# Patient Record
Sex: Female | Born: 1960 | Race: White | Hispanic: No | Marital: Married | Smoking: Former smoker
Health system: Southern US, Community
[De-identification: ages and names within clinical notes are randomized; demographics above are authoritative.]

## PROBLEM LIST (undated history)

## (undated) DIAGNOSIS — E079 Disorder of thyroid, unspecified: Secondary | ICD-10-CM

## (undated) DIAGNOSIS — J329 Chronic sinusitis, unspecified: Secondary | ICD-10-CM

## (undated) DIAGNOSIS — F419 Anxiety disorder, unspecified: Secondary | ICD-10-CM

## (undated) HISTORY — PX: THYROIDECTOMY: SHX17

## (undated) HISTORY — PX: COLON SURGERY: SHX602

---

## 1998-04-29 ENCOUNTER — Other Ambulatory Visit: Admission: RE | Admit: 1998-04-29 | Discharge: 1998-04-29 | Payer: Self-pay | Admitting: Obstetrics and Gynecology

## 1998-10-24 ENCOUNTER — Inpatient Hospital Stay (HOSPITAL_COMMUNITY): Admission: AD | Admit: 1998-10-24 | Discharge: 1998-10-26 | Payer: Self-pay | Admitting: Obstetrics and Gynecology

## 2008-01-05 ENCOUNTER — Encounter: Admission: RE | Admit: 2008-01-05 | Discharge: 2008-01-05 | Payer: Self-pay | Admitting: Family Medicine

## 2008-01-14 ENCOUNTER — Encounter: Admission: RE | Admit: 2008-01-14 | Discharge: 2008-01-14 | Payer: Self-pay | Admitting: Family Medicine

## 2008-04-14 ENCOUNTER — Encounter (INDEPENDENT_AMBULATORY_CARE_PROVIDER_SITE_OTHER): Payer: Self-pay | Admitting: Interventional Radiology

## 2008-04-14 ENCOUNTER — Encounter: Admission: RE | Admit: 2008-04-14 | Discharge: 2008-04-14 | Payer: Self-pay | Admitting: Endocrinology

## 2008-04-14 ENCOUNTER — Other Ambulatory Visit: Admission: RE | Admit: 2008-04-14 | Discharge: 2008-04-14 | Payer: Self-pay | Admitting: Interventional Radiology

## 2008-08-16 ENCOUNTER — Other Ambulatory Visit: Admission: RE | Admit: 2008-08-16 | Discharge: 2008-08-16 | Payer: Self-pay | Admitting: Internal Medicine

## 2008-08-27 ENCOUNTER — Ambulatory Visit (HOSPITAL_COMMUNITY): Admission: RE | Admit: 2008-08-27 | Discharge: 2008-08-28 | Payer: Self-pay | Admitting: Surgery

## 2008-08-27 ENCOUNTER — Encounter (INDEPENDENT_AMBULATORY_CARE_PROVIDER_SITE_OTHER): Payer: Self-pay | Admitting: Surgery

## 2008-08-31 ENCOUNTER — Emergency Department (HOSPITAL_COMMUNITY): Admission: EM | Admit: 2008-08-31 | Discharge: 2008-08-31 | Payer: Self-pay | Admitting: Emergency Medicine

## 2008-11-25 ENCOUNTER — Encounter (INDEPENDENT_AMBULATORY_CARE_PROVIDER_SITE_OTHER): Payer: Self-pay | Admitting: *Deleted

## 2008-11-25 ENCOUNTER — Ambulatory Visit (HOSPITAL_COMMUNITY): Admission: RE | Admit: 2008-11-25 | Discharge: 2008-11-25 | Payer: Self-pay | Admitting: *Deleted

## 2008-12-02 ENCOUNTER — Encounter: Admission: RE | Admit: 2008-12-02 | Discharge: 2008-12-02 | Payer: Self-pay | Admitting: *Deleted

## 2008-12-10 ENCOUNTER — Ambulatory Visit (HOSPITAL_COMMUNITY): Admission: RE | Admit: 2008-12-10 | Discharge: 2008-12-10 | Payer: Self-pay | Admitting: Surgery

## 2008-12-21 ENCOUNTER — Encounter (INDEPENDENT_AMBULATORY_CARE_PROVIDER_SITE_OTHER): Payer: Self-pay | Admitting: Surgery

## 2008-12-21 ENCOUNTER — Inpatient Hospital Stay (HOSPITAL_COMMUNITY): Admission: RE | Admit: 2008-12-21 | Discharge: 2008-12-26 | Payer: Self-pay | Admitting: Surgery

## 2010-03-27 ENCOUNTER — Encounter: Admission: RE | Admit: 2010-03-27 | Discharge: 2010-03-27 | Payer: Self-pay | Admitting: Family Medicine

## 2010-03-27 ENCOUNTER — Ambulatory Visit: Payer: Self-pay | Admitting: Family Medicine

## 2010-03-27 DIAGNOSIS — M25569 Pain in unspecified knee: Secondary | ICD-10-CM | POA: Insufficient documentation

## 2010-03-27 DIAGNOSIS — E890 Postprocedural hypothyroidism: Secondary | ICD-10-CM | POA: Insufficient documentation

## 2010-03-27 DIAGNOSIS — M533 Sacrococcygeal disorders, not elsewhere classified: Secondary | ICD-10-CM | POA: Insufficient documentation

## 2010-03-27 DIAGNOSIS — M25559 Pain in unspecified hip: Secondary | ICD-10-CM | POA: Insufficient documentation

## 2010-03-27 DIAGNOSIS — C189 Malignant neoplasm of colon, unspecified: Secondary | ICD-10-CM | POA: Insufficient documentation

## 2010-03-29 ENCOUNTER — Encounter: Admission: RE | Admit: 2010-03-29 | Discharge: 2010-04-24 | Payer: Self-pay | Admitting: Family Medicine

## 2010-04-17 ENCOUNTER — Ambulatory Visit: Payer: Self-pay | Admitting: Family Medicine

## 2010-04-17 ENCOUNTER — Other Ambulatory Visit: Admission: RE | Admit: 2010-04-17 | Discharge: 2010-04-17 | Payer: Self-pay | Admitting: Family Medicine

## 2010-04-18 ENCOUNTER — Encounter: Payer: Self-pay | Admitting: Family Medicine

## 2010-04-18 LAB — CONVERTED CEMR LAB
LDL Cholesterol: 129 mg/dL — ABNORMAL HIGH (ref 0–99)
Triglycerides: 90 mg/dL (ref <150)
VLDL: 18 mg/dL (ref 0–40)

## 2010-04-19 LAB — CONVERTED CEMR LAB
ALT: 10 units/L (ref 0–35)
AST: 14 units/L (ref 0–37)
Albumin: 4.2 g/dL (ref 3.5–5.2)
CO2: 21 meq/L (ref 19–32)
Calcium: 8.3 mg/dL — ABNORMAL LOW (ref 8.4–10.5)
Chloride: 106 meq/L (ref 96–112)
Creatinine, Ser: 0.76 mg/dL (ref 0.40–1.20)
Potassium: 4.1 meq/L (ref 3.5–5.3)
Total Protein: 7 g/dL (ref 6.0–8.3)

## 2010-04-20 ENCOUNTER — Encounter: Payer: Self-pay | Admitting: Family Medicine

## 2010-04-20 LAB — CONVERTED CEMR LAB: Pap Smear: NEGATIVE

## 2010-07-04 NOTE — Assessment & Plan Note (Signed)
Summary: CPE with pap   Vital Signs:  Patient profile:   50 year old female Menstrual status:  regular Height:      66 inches Weight:      194 pounds BMI:     31.43 O2 Sat:      97 % on Room air Pulse rate:   74 / minute BP sitting:   107 / 69  (left arm) Cuff size:   regular  Vitals Entered By: Payton Spark CMA (April 17, 2010 10:40 AM)  O2 Flow:  Room air CC: CPE w/ pap   Primary Care Kenzie Flakes:  Seymour Bars DO  CC:  CPE w/ pap.  History of Present Illness: 50 yo F presents for CPE with pap smear.   She has hx of colon cancer in June 2010, treated with surgery and chemo.  She has recently had a f/u colonoscopy with Dr Loreta Ave that was 'normal'.  She is unsure of last Tetanus vaccine.  She is perimenopausal with irregular periods.  Due for fasting labs today.  Declined flu shot.    Married, monogamous.  No hx of cervical dysplasia. Mammogram done this summer.  Current Medications (verified): 1)  Clonazepam 0.5 Mg Tabs (Clonazepam) .... Take 1 Tab By Mouth Once Daily As Needed 2)  Calcitriol 0.5 Mcg Caps (Calcitriol) .... Take 1 Cap By Mouth Once Daily 3)  Levothroid 125 Mcg Tabs (Levothyroxine Sodium) .... Take 1 Tab By Mouth Once Daily 4)  Restora  Caps (Probiotic Product) .... Take 1 Cap By Mouth Once Daily 5)  Vitamin D 1000 Unit Tabs (Cholecalciferol) .... Take 1 Tab By Mouth Once Daily 6)  Stool Softener 100 Mg Caps (Docusate Sodium) 7)  Calcium 600 1500 Mg Tabs (Calcium Carbonate) 8)  Tums Ultra 1000 1000 Mg Chew (Calcium Carbonate Antacid)  Allergies (verified): No Known Drug Allergies  Past History:  Past Medical History: Reviewed history from 03/27/2010 and no changes required. colon cancer June 2010, [facility], Dr Loreta Ave pre- cancerous thyroid cells -- Dr Donney Rankins  Past Surgical History: Reviewed history from 03/27/2010 and no changes required. thyroidectomy 08-2008 partial colectomy for colon cancer 12-2008; repeat 04-2009  Family  History: Reviewed history from 03/27/2010 and no changes required. father bladder cancer no fam hx of colorectal cancer. mother healthy no sibblings.  Social History: Reviewed history from 03/27/2010 and no changes required. Married.  Has a 49 yo son and 74 yo daughter. 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 Denies ETOH. [place]ginially from [place]   Some exercise.    Review of Systems       The patient complains of weight gain.  The patient denies anorexia, fever, weight loss, vision loss, decreased hearing, hoarseness, chest pain, syncope, dyspnea on exertion, peripheral edema, prolonged cough, headaches, hemoptysis, abdominal pain, melena, hematochezia, severe indigestion/heartburn, hematuria, incontinence, genital sores, muscle weakness, suspicious skin lesions, transient blindness, difficulty walking, depression, unusual weight change, abnormal bleeding, enlarged lymph nodes, angioedema, breast masses, and testicular masses.    Physical Exam  General:  alert, well-developed, well-nourished, well-hydrated, and overweight-appearing.   Head:  normocephalic and atraumatic.   Eyes:  pupils equal, pupils round, and pupils reactive to light.   Ears:  no external deformities.   Nose:  no nasal discharge.   Mouth:  good dentition and pharynx pink and moist.   Neck:  no masses.   Breasts:  No mass, nodules, thickening, tenderness, bulging, retraction, inflamation, nipple discharge or skin changes noted.  Lungs:  Normal respiratory effort, chest expands symmetrically. Lungs are clear to auscultation, no crackles or wheezes. Heart:  Normal rate and regular rhythm. S1 and S2 normal without gallop, murmur, click, rub or other extra sounds. Abdomen:  Bowel sounds positive,abdomen soft and non-tender without masses, organomegaly or hernias noted. Genitalia:  Pelvic Exam:        External: normal female genitalia without lesions or masses        Vagina: normal without  lesions or masses        Cervix: normal without lesions, +cervical polyp (small) present from the os without bleeding        Adnexa: normal bimanual exam without masses or fullness        Uterus: normal by palpation        Pap smear: performed Pulses:  2+ radial and pedal pulses Extremities:  no LE edema   Impression & Recommendations:  Problem # 1:  ROUTINE GYNECOLOGICAL EXAMINATION (ICD-V72.31) Thin prep pap done.   Repeat in 1 yr due to cervical polyp. BP at goal.  BMI 31 c/w class I obesity. Mammo UTD. Tetanus unknown. Seeing GI annual due to hx of colon cancer.  Seeing Dr Talmage Nap for thyroid. Fasting labs today. Add MVI to calcium + D daily.  Complete Medication List: 1)  Clonazepam 0.5 Mg Tabs (Clonazepam) .... Take 1 tab by mouth once daily as needed 2)  Calcitriol 0.5 Mcg Caps (Calcitriol) .... Take 1 cap by mouth once daily 3)  Levothroid 125 Mcg Tabs (Levothyroxine sodium) .... Take 1 tab by mouth once daily 4)  Restora Caps (Probiotic product) .... Take 1 cap by mouth once daily 5)  Vitamin D 1000 Unit Tabs (Cholecalciferol) .... Take 1 tab by mouth once daily 6)  Stool Softener 100 Mg Caps (Docusate sodium) 7)  Calcium 600 1500 Mg Tabs (Calcium carbonate) 8)  Tums Ultra 1000 1000 Mg Chew (Calcium carbonate antacid)  Other Orders: T-Coombs test, Direct (29562) T-Lipid Profile (13086-57846) T-TSH ([id number])   Orders Added: 1)  T-Coombs test, Direct [[id number]] 2)  T-Lipid Profile [[id number]] 3)  T-TSH [[id number]] 4)  Est. Patient age 34-64 [phone]

## 2010-07-04 NOTE — Miscellaneous (Signed)
Summary: PT Discharge/[facility]  PT Discharge/[facility]   Imported By: Lanelle Bal 05/09/2010 13:48:25  _____________________________________________________________________  External Attachment:    Type:   Image     Comment:   External Document

## 2010-07-04 NOTE — Assessment & Plan Note (Signed)
Summary: NOV R knee pain   Vital Signs:  Patient profile:   50 year old female Menstrual status:  regular LMP:     03/04/2010 Height:      66 inches Weight:      191 pounds BMI:     30.94 O2 Sat:      97 % on Room air Temp:     98.5 degrees F oral Pulse rate:   64 / minute BP sitting:   106 / 73  (left arm) Cuff size:   regular  Vitals Entered By: Payton Spark CMA (March 27, 2010 10:16 AM)  O2 Flow:  Room air CC: New to est. C/o constant R leg pain below knee x 2 months.  LMP (date): 03/04/2010     Menstrual Status regular Enter LMP: 03/04/2010   Primary Care Provider:  Seymour Bars DO  CC:  New to est. C/o constant R leg pain below knee x 2 months. .  History of Present Illness: 50 yo WF presents for NOV.  She was diagnosed with colon cancer in June 2010 and had 2 colon resections followed by [facility].  She had chemotherapy after a LN came back +.  She has f/ u with oncology at [facility] and GI with Dr Loreta Ave.  She is scheduled for a f/u colonoscopy this month.  She has started feeling better.  Her energy level is improving.    She had her thyroid removed 08-2008 for pre cancerous cells.  She sees Dr Talmage Nap and is on levothyroxine.  She has had 2 mos of R knee pain, mostly on the lateral side.  She also has some pain in her tailbone x 2 yrs.  No trauma.  Works as a Production assistant, radio.  Pain only with going up and down stairs.  No redness, swelling or giving way.  Also some R hip pain and chronic LBP which she sees a chiropractor for.   Current Medications (verified): 1)  Clonazepam 0.5 Mg Tabs (Clonazepam) .... Take 1 Tab By Mouth Once Daily As Needed 2)  Calcitriol 0.5 Mcg Caps (Calcitriol) .... Take 1 Cap By Mouth Once Daily 3)  Levothroid 125 Mcg Tabs (Levothyroxine Sodium) .... Take 1 Tab By Mouth Once Daily 4)  Restora  Caps (Probiotic Product) .... Take 1 Cap By Mouth Once Daily 5)  Vitamin D 1000 Unit Tabs (Cholecalciferol) .... Take 1 Tab By Mouth Once Daily 6)  Stool Softener  100 Mg Caps (Docusate Sodium) 7)  Calcium 600 1500 Mg Tabs (Calcium Carbonate) 8)  Tums Ultra 1000 1000 Mg Chew (Calcium Carbonate Antacid)  Allergies (verified): No Known Drug Allergies  Past History:  Past Medical History: colon cancer June 2010, [facility], Dr Loreta Ave pre- cancerous thyroid cells -- Dr Donney Rankins  Past Surgical History: thyroidectomy 08-2008 partial colectomy for colon cancer 12-2008; repeat 04-2009  Family History: father bladder cancer no fam hx of colorectal cancer. mother healthy no sibblings.  Social History: Married.  Has a 30 yo son and 73 yo daughter. 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 Denies ETOH. [place]ginially from [place]   Some exercise.    Review of Systems       no fevers/sweats/weakness, unexplained wt loss/gain, no change in vision, no difficulty hearing, ringing in ears, no hay fever/allergies, no CP/discomfort, no palpitations, no breast lump/nipple discharge, no cough/wheeze, no blood in stool, no N/V/D, no nocturia, no leaking urine, no unusual vag bleeding, no vaginal/penile discharge, no muscle/joint pain, no  rash, no new/changing mole, no HA, no memory loss, no anxiety, no sleep problem, no depression, no unexplained lumps, no easy bruising/bleeding, no concern with sexual function   Physical Exam  General:  alert, well-developed, well-nourished, well-hydrated, and overweight-appearing.   Head:  normocephalic and atraumatic.   Eyes:  sclera non icteric Mouth:  good dentition and pharynx pink and moist.   Neck:  no masses.   Lungs:  Normal respiratory effort, chest expands symmetrically. Lungs are clear to auscultation, no crackles or wheezes. Heart:  Normal rate and regular rhythm. S1 and S2 normal without gallop, murmur, click, rub or other extra sounds. Abdomen:  soft, non-tender, and no distention.   Msk:  point tender over coccyx but no bruising, edema or redness.  full L spine flex/ ext. limited  bilat hip external rotation with FABER testing.   no R knee effusion.  lateral joint line tenderness with a negative Mc Murray test.  Neg Lachmans test.   Pulses:  2+ radial and pedal pulses Extremities:  no LE edema Neurologic:  gait normal and DTRs symmetrical and normal.   Skin:  color normal.     Impression & Recommendations:  Problem # 1:  KNEE PAIN, RIGHT (ICD-719.46) R knee pain with normal exam findings.  Likely to be Patellofemoral syndrome.  F/U xrays.  Use Advil as needed.  If xrays are normal, will refer to PT to eval and tx for patellofemoral syndrome. Orders: T-DG Knee 3 Views R [id number])  Problem # 2:  HIP PAIN, RIGHT (ICD-719.45) R hip tendernss with full ROM on exam.  Xray to r/o referred R knee pain. Orders: T-DG Hip Complete*R* ([id number])  Problem # 3:  COCCYGEAL PAIN (ICD-724.79) ? distant hx of trauma.  Will xray today to look for old fx. Orders: T-DG Sacrum/Coccyx (27253)  Problem # 4:  COLON CANCER (ICD-153.9) Has proper f/u with [facility] oncology and Dr Loreta Ave for colonoscopy and PET scans before the end of the year. Her cancer was diagnosed 11-2008 at age 50, s/p partial colectomy and chemo.  Complete Medication List: 1)  Clonazepam 0.5 Mg Tabs (Clonazepam) .... Take 1 tab by mouth once daily as needed 2)  Calcitriol 0.5 Mcg Caps (Calcitriol) .... Take 1 cap by mouth once daily 3)  Levothroid 125 Mcg Tabs (Levothyroxine sodium) .... Take 1 tab by mouth once daily 4)  Restora Caps (Probiotic product) .... Take 1 cap by mouth once daily 5)  Vitamin D 1000 Unit Tabs (Cholecalciferol) .... Take 1 tab by mouth once daily 6)  Stool Softener 100 Mg Caps (Docusate sodium) 7)  Calcium 600 1500 Mg Tabs (Calcium carbonate) 8)  Tums Ultra 1000 1000 Mg Chew (Calcium carbonate antacid)  Patient Instructions: 1)  Have Xrays done downstairs today. 2)  Will call you w/ results this evening. 3)  If knee/ hip xrays, are normal, will discuss treatment for patellofemoral syndrome  (physical therapy). 4)  Use Advil as needed.     Orders Added: 1)  T-DG Sacrum/Coccyx [72220] 2)  T-DG Hip Complete*R* [73510] 3)  T-DG Knee 3 Views R [73562.3] 4)  New Patient Level III [66440]

## 2010-09-10 LAB — PROTIME-INR
INR: 0.9 (ref 0.00–1.49)
Prothrombin Time: 12.4 seconds (ref 11.6–15.2)

## 2010-09-10 LAB — URINALYSIS, ROUTINE W REFLEX MICROSCOPIC
Glucose, UA: NEGATIVE mg/dL
Nitrite: NEGATIVE
Protein, ur: NEGATIVE mg/dL
Urobilinogen, UA: 0.2 mg/dL (ref 0.0–1.0)

## 2010-09-10 LAB — BASIC METABOLIC PANEL
BUN: 8 mg/dL (ref 6–23)
CO2: 25 mEq/L (ref 19–32)
Calcium: 6.8 mg/dL — ABNORMAL LOW (ref 8.4–10.5)
Creatinine, Ser: 0.56 mg/dL (ref 0.4–1.2)
GFR calc non Af Amer: >60 mL/min (ref >60)
Glucose, Bld: 132 mg/dL — ABNORMAL HIGH (ref 70–99)
Sodium: 137 mEq/L (ref 135–145)

## 2010-09-10 LAB — CBC
Hemoglobin: 13.7 g/dL (ref 12.0–15.0)
MCHC: 34.2 g/dL (ref 30.0–36.0)
MCHC: 34.5 g/dL (ref 30.0–36.0)
Platelets: 137 10*3/uL — ABNORMAL LOW (ref 150–400)
RBC: 4.2 MIL/uL (ref 3.87–5.11)
RDW: 13.2 % (ref 11.5–15.5)

## 2010-09-10 LAB — COMPREHENSIVE METABOLIC PANEL
Albumin: 4.1 g/dL (ref 3.5–5.2)
BUN: 11 mg/dL (ref 6–23)
Creatinine, Ser: 0.72 mg/dL (ref 0.4–1.2)
GFR calc Af Amer: >60 mL/min (ref >60)
Potassium: 4 mEq/L (ref 3.5–5.1)
Sodium: 139 mEq/L (ref 135–145)
Total Bilirubin: 0.6 mg/dL (ref 0.3–1.2)

## 2010-09-10 LAB — DIFFERENTIAL
Basophils Absolute: 0 10*3/uL (ref 0.0–0.1)
Lymphocytes Relative: 32 % (ref 12–46)
Monocytes Absolute: 0.5 10*3/uL (ref 0.1–1.0)
Neutro Abs: 3.2 10*3/uL (ref 1.7–7.7)

## 2010-09-10 LAB — TYPE AND SCREEN: ABO/RH(D): B POS

## 2010-09-14 LAB — CBC
HCT: 41.2 % (ref 36.0–46.0)
Hemoglobin: 14.1 g/dL (ref 12.0–15.0)
MCV: 93 fL (ref 78.0–100.0)
Platelets: 209 10*3/uL (ref 150–400)
RDW: 12.9 % (ref 11.5–15.5)

## 2010-09-14 LAB — BASIC METABOLIC PANEL
BUN: 8 mg/dL (ref 6–23)
CO2: 26 mEq/L (ref 19–32)
Calcium: 7.6 mg/dL — ABNORMAL LOW (ref 8.4–10.5)
Chloride: 106 mEq/L (ref 96–112)
GFR calc non Af Amer: >60 mL/min (ref >60)
Glucose, Bld: 93 mg/dL (ref 70–99)
Glucose, Bld: 100 mg/dL — ABNORMAL HIGH (ref 70–99)
Potassium: 4 mEq/L (ref 3.5–5.1)
Sodium: 138 mEq/L (ref 135–145)

## 2010-09-14 LAB — DIFFERENTIAL
Basophils Absolute: 0 10*3/uL (ref 0.0–0.1)
Eosinophils Absolute: 0.1 10*3/uL (ref 0.0–0.7)
Eosinophils Relative: 1 % (ref 0–5)
Lymphs Abs: 1.7 10*3/uL (ref 0.7–4.0)
Monocytes Absolute: 0.6 10*3/uL (ref 0.1–1.0)

## 2010-09-14 LAB — URINALYSIS, ROUTINE W REFLEX MICROSCOPIC
Ketones, ur: NEGATIVE mg/dL
Nitrite: NEGATIVE
Specific Gravity, Urine: 1.016 (ref 1.005–1.030)
pH: 7.5 (ref 5.0–8.0)

## 2010-09-14 LAB — PROTIME-INR: INR: 0.9 (ref 0.00–1.49)

## 2010-10-17 NOTE — Op Note (Signed)
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   PROCEDURE:  Total thyroidectomy.   SURGEON:  Velora Heckler, MD, FACS   ANESTHESIA:  General per Dr. Jacklynn Bue.   ESTIMATED BLOOD LOSS:  Minimal.   PREPARATION:  ChloraPrep.   BLOOD LOSS:  Minimal.   INDICATIONS:  The patient is a 50 year old female from [place]  She  lives in [place].  She works at [employer].  She has had  a longstanding thyroid goiter with mild dysphagia.  She has developed  hyperthyroidism, controlled medically.  Recent thyroid ultrasound showed  2 right-sided thyroid nodules measuring 2.0 cm and 1.3 cm.  Biopsies  showed slight nuclear atypia and Hurthle cell change.  The patient now  comes to surgery for thyroidectomy.   BODY OF REPORT:  Procedure was done in OR #60 at the [facility].  The patient was brought to the operating room and  placed in the supine position on the operating room table.  Following  administration of general anesthesia, the patient was positioned and  then prepped and draped in the usual strict aseptic fashion.  After  ascertaining that an adequate level of anesthesia had been achieved, a  Kocher incision was made with a #15 blade.  Dissection was carried  through the subcutaneous tissues and platysma.  Hemostasis was obtained  with electrocautery.  Subplatysmal flaps were developed from the thyroid  notch to the sternal  notch.  A Mahorner self-retaining retractor was  placed for exposure.  Strap muscles were incised in the midline.  Dissection was begun on the left side of the neck.  Left thyroid lobe  was exposed.  Middle thyroid vein was divided between Ligaclips with the  harmonic scalpel.  Gland was gently mobilized.  Superior pole vessels  were divided between medium Ligaclips with the harmonic scalpel.  Gland  was rolled anteriorly.  Recurrent laryngeal nerve was identified and  preserved.  Inferior parathyroid gland was found on the capsule of the  thyroid.  With gentle careful dissection, it was mobilized off of the  capsule on its vascular pedicle which was preserved.  Gland was rolled  further anteriorly and the ligament of Allyson Sabal was transected with  electrocautery taking care to avoid the area of the recurrent nerve.  Gland was mobilized up and onto the anterior trachea.  There was a small  pyramidal lobe which was excised en bloc with the thyroid isthmus using  the harmonic scalpel.  Dry pack was placed in the left neck.   Next, we turned our attention to the right thyroid lobe.  Again, strap  muscles were reflected laterally.  Right lobe was exposed.  Middle vein  was divided between medium Ligaclips.  Inferior venous tributaries were  divided between medium Ligaclips using the harmonic scalpel.  Superior  pole was dissected out.  Superior pole vessels were divided between  medium Ligaclips with a harmonic scalpel.  Superior parathyroid gland  was identified on the right and preserved on its vascular pedicle.  Gland was rolled anteriorly.  Branches of the inferior thyroid artery  were divided between small Ligaclips with the harmonic scalpel.  Recurrent nerve was identified and preserved.  Ligament of Allyson Sabal was  transected with electrocautery and the gland was mobilized up and onto  the anterior trachea from which it was completely excised using the  harmonic scalpel.  Sutures were used to  mark the superior pole of the  gland.  It was submitted to pathology for review.  Neck was irrigated  with warm saline.  Good hemostasis was noted.  Surgicel was placed in  the operative field bilaterally.  Strap muscles were reapproximated in  the midline with interrupted 3-0 Vicryl sutures.  Platysma was closed  with interrupted 3-0 Vicryl sutures.  Skin was closed with running 4-0  Monocryl subcuticular suture.  Wound was washed and dried and benzoin  and Steri-Strips were applied.  Sterile dressings were applied.  The  patient was awakened from anesthesia and brought to the recovery room in  stable condition.  The patient tolerated the procedure well.      Velora Heckler, MD  Electronically Signed     TMG/MEDQ  D:  08/27/2008  T:  08/28/2008  Job:  [id number]   cc:   Massie Maroon, MD  Dorisann Frames, M.D.

## 2010-10-17 NOTE — Consult Note (Signed)
NAMEMARYKAY, MCCLEOD NO.:  [id number]   MEDICAL RECORD NO.:  [id number]          PATIENT TYPE:  EMS   LOCATION:  ED                           FACILITY:  [facility]   PHYSICIAN:  Angelia Mould. Derrell Lolling, M.D.DATE OF BIRTH:  [birth year]   DATE OF CONSULTATION:  08/31/2008  DATE OF DISCHARGE:                                 CONSULTATION   CHIEF COMPLAINT:  Numbness and tingling, fatigue.   HISTORY OF PRESENT ILLNESS:  This is a healthy 50 year old [place]  woman who is now 5 days status post total thyroidectomy by Dr. Darnell Level.  She had hyperthyroidism, a benign goiter, and an adenomatous  nodule.  The surgery was uneventful.  She was discharged home on  Synthroid and Tums.  She came to the urgent clinic today and saw Dr.  Johna Sheriff because of numbness and tingling of the mouth, hands and legs.  While in the office, she was alert and in no distress but had profoundly  positive Chvostek's sign bilaterally.  Dr. Johna Sheriff sent her to the  [facility] Emergency Room for my evaluation and decision making  regarding management of presumed hypocalcemia.   She denies any voice change.  She denies any swallowing problems.  She  denies problems with her neck wound.  She denies headache.  She denies  any syncopal episodes or muscle twitching.   PAST HISTORY:  Hyperthyroidism.  Anxiety disorder.  Tonsillectomy.  Ovarian surgery.   MEDICATIONS:  1. Synthroid 100 mcg per day.  2. Tums 6 tablets a day.   DRUG ALLERGIES:  None known.   SOCIAL HISTORY:  She is married, has 2 children.  She works at the  [employer].  She does not smoke, does not drink alcohol.   FAMILY HISTORY:  Unremarkable.  No history of endocrine disorder.   REVIEW OF SYSTEMS:  A 10-system review of systems is noncontributory.   PHYSICAL EXAMINATION:  GENERAL:  Pleasant, healthy-appearing, middle-  aged woman in no distress.  VITAL SIGNS:  Temperature 98.1, blood pressure 129/87, pulse 65,  respirations 16, oxygen saturation 100%.  HEENT:  Eyes:  Sclerae are clear.  Extraocular movements are intact.  No  exophthalmos.  Ears, nose, mouth, and throat:  Nose, lips, tongue and  oropharynx are without gross lesions.  Chvostek's sign is briskly  positive bilaterally.  NECK:  Supple, nontender.  No mass. Healing thyroidectomy scar fairly  fresh, Steri-Strips in place.  No hematoma, no swelling.  No mass.  Voice is normal.  LUNGS:  Clear to auscultation.  No chest wall tenderness.  HEART:  Regular rate and rhythm.  No murmur.  No ectopy.  ABDOMEN:  Soft and nontender.  No mass.   LABORATORY WORK:  Calcium level is 7.6 which is down from calcium level  of 8 on August 28, 2008.  Glucose is 100, potassium 3.5, magnesium 1.8.  EKG shows sinus bradycardia and is otherwise normal.   ASSESSMENT:  1. 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  2. Status post recent thyroidectomy for benign goiter with      hyperthyroidism and adenomatous nodule.   PLAN:  1. The patient will receive 2 ampules of calcium gluconate IV over the      next 2 hours.  2. We will start her on Rocaltrol 0.5 mcg per day.  3. She will continue her Synthroid 100 mcg per day and take that and      the Rocaltrol in the morning before breakfast.  4. We will put her on Tums 1000 mg calcium carbonate at 10:00 a.m.,      2:00 p.m., 4:00 p.m. and 8:00 p.m. daily.  5. We will ask her to come back to the office this Thursday, September 02, 2008, and we will arrange to have serum calcium level drawn before      she sees one of [place] in the office that afternoon.      Angelia Mould. Derrell Lolling, M.D.  Electronically Signed     HMI/MEDQ  D:  08/31/2008  T:  08/31/2008  Job:  [id number]   cc:   Velora Heckler, MD  [address] [place]  [place] [address]

## 2010-10-17 NOTE — Op Note (Signed)
Jennifer Reeves, Jennifer Reeves NO.:  [id number]   MEDICAL RECORD NO.:  [id number]          PATIENT TYPE:  INP   LOCATION:  5128                         FACILITY:  [facility]   PHYSICIAN:  Velora Heckler, MD      DATE OF BIRTH:  07-14-1960   DATE OF PROCEDURE:  12/21/2008  DATE OF DISCHARGE:                               OPERATIVE REPORT   PREOPERATIVE DIAGNOSIS:  Colon carcinoma.   POSTOPERATIVE DIAGNOSIS:  Colon carcinoma.   PROCEDURES:  1. Sigmoid colectomy.  2. Intraoperative rigid sigmoidoscopy.   SURGEON:  Velora Heckler, MD, FACS   ASSISTANT:  Brayton El, [facility]   ANESTHESIA:  General per Dr. Laverle Hobby.   ESTIMATED BLOOD LOSS:  Minimal.   PREPARATION:  ChloraPrep.   COMPLICATIONS:  None.   INDICATIONS:  The patient is a 50 year old white female who experienced  onset of intermittent rectal bleeding.  This had occurred on and off for  approximately 2 years.  She underwent colonoscopy on November 25, 2008 by  Dr. Sabino Gasser.  She was found to have a polypoid mass at 40 cm from the  anus.  This was excised endoscopically and the site was marked with  [place] Ink.  Pathology showed adenocarcinoma.  The patient now comes to  Surgery for segmental resection.   BODY OF REPORT:  Procedure was done in OR #17 at [facility].  The patient was brought to the operating room and placed in  supine position on the operating room table.  Following administration  of general anesthesia, the patient was prepped and draped in the usual  strict aseptic fashion.  After ascertaining that an adequate level of  anesthesia had been achieved, a midline abdominal incision was made with  a #10 blade.  Dissection was carried through subcutaneous tissues.  Fascia was incised in the midline and the peritoneal cavity was entered  cautiously.  Abdomen was explored.  Liver was normal to palpation.  Stomach was normal.  Small bowel appears normal.  There was a moderately  redundant sigmoid colon.  However, close inspection from the peritoneal  reflection in the pelvis to the cecum shows no sign of [place] Ink  tattooing.  Palpation reveals no mass.  Review of the endoscopy  operative report shows that the lesion was located 40 cm from the anus  and was marked with [place] Ink.  A point in the distal sigmoid colon was  selected.  Using stay sutures, a colotomy was made.  A sterile rigid  sigmoidoscope was brought on the field and was utilized to perform  endoscopy proximally and distally for approximately 30 cm in each  direction.  No sign of [place] Ink tattooing was identified.  No residual  polyp was identified.   After thorough endoscopic exam, a decision was made to proceed with  resection of the sigmoid colon.  A 40 cm from the anus, we placed the  location in the distal sigmoid.  A point approximately 12 cm distal to  this point was selected just above the peritoneal reflection and a point  in the distal descending colon was selected.  Mesentery was taken down  using the harmonic scalpel.  Bowel was transected between bowel clamps  sharply and the specimen was excised.  Larger vessels were ligated with  2-0 silk ligatures.  The specimen was opened on the back table but no  evidence of [place] Ink was identified.  No evidence of a polypoid mass  was identified.  Specimen was submitted fresh to Pathology and was  reviewed by Dr. Hollice Espy.  Again, no evidence of polyp or previous  polypectomy was identified.  No evidence of [place] Ink was identified.   At the initiation of the end-to-end anastomosis, a nodular density was  noted on the posterior wall of the bowel at the distal margin.  This was  felt to represent the site of previous polypectomy.  Therefore, the  mesentery was dissected away from the bowel down to the upper rectum.  The mesenteric sample was excised in its entirety and vessels were  ligated with 2-0 silk ties.  The mesenteric sample was  submitted  separately to Pathology in hopes that an additional lymph node material  would be identified.  An additional resection then of the distal sigmoid  and proximal rectum was performed.  This sleeve resection was submitted  fresh to Pathology and Dr. Hollice Espy again examined it.  A suture was  used to mark the presumed site of the previous polypectomy.  Dr. Dierdre Searles  agrees that this does appear to be a mass lesion within the wall of the  bowel.  We did achieve approximately a 2-cm distal margin.  No [place] Ink  was identified at the site.   Next, a end-to-end anastomosis was completed between the distal  descending colon and the proximal rectum with interrupted 3-0 silk  sutures.  Good hemostasis was noted.  No tension was present on the  anastomosis.  Abdomen was irrigated copiously with warm saline which was  evacuated.  Omentum was used to cover the small bowel.  Midline  abdominal incision was closed with interrupted #1 Vicryl sutures.  Subcutaneous tissues were irrigated.  Skin was closed with stainless  steel staples.  Sterile dressings were applied.  The patient was  awakened from anesthesia and brought to the recovery room in stable  condition.  The patient tolerated the procedure well.      Velora Heckler, MD  Electronically Signed     TMG/MEDQ  D:  12/21/2008  T:  12/22/2008  Job:  [id number]   cc:   Georgiana Spinner, M.D.  Quita Skye Artis Flock, M.D.  Dorisann Frames, M.D.

## 2010-10-17 NOTE — Op Note (Signed)
NAMEKATHARIN, Jennifer Reeves NO.:  [id number]   MEDICAL RECORD NO.:  [id number]          PATIENT TYPE:  AMB   LOCATION:  ENDO                         FACILITY:  [facility]   PHYSICIAN:  Georgiana Spinner, M.D.    DATE OF BIRTH:  [birth year]   DATE OF PROCEDURE:  11/25/2008  DATE OF DISCHARGE:                               OPERATIVE REPORT   PROCEDURE:  Colonoscopy.   ENDOSCOPIST:  Georgiana Spinner, M.D.   INDICATIONS:  Rectal bleeding.   ANESTHESIA:  Fentanyl 100 mcg, Versed 10 mg, Benadryl 25 mg.   DESCRIPTION OF PROCEDURE:  With the patient mildly sedated in the left  lateral decubitus position, the Pentax videoscopic pediatric colonoscope  was inserted in the rectum and passed under direct vision, with pressure  applied to reach the cecum, identified by the ileocecal valve and  appendiceal orifice, both of which were photographed. From this point  the colonoscope was slowly withdrawn, taking circumferential views of  the colonic mucosa, stopping only at 40 cm from the anal verge, at which  point a polyp was seen and photographed.  After viewing it, I felt that  this would be something that would not be easily removed, even in a  piecemeal fashion.  I thought it might be a mass actually as well, so I  biopsied this, and then for localization at 40 cm from anal verge, I  injected 1 mL of [place] ink submucosally, to localize this in case and  she needs, which I think she will, a surgical removal.  The endoscope  was then withdrawn to the rectum which appeared normal on direct vision,  and showed a small hemorrhoid on the retroflexed view.  The endoscope  was straightened and withdrawn.  The patient's vital signs and pulse  oximeter remained stable.   The patient tolerated procedure well without apparent complications.   FINDINGS:  Small internal hemorrhoid and a mass-like polyp at 40 cm from  the anal verge, that I think will need to be removed surgically rather  than  endoscopically.   PLAN:  Await the biopsy report.  The patient will call me for results  and follow up with me as needed as an outpatient.           ______________________________  Georgiana Spinner, M.D.     GMO/MEDQ  D:  11/25/2008  T:  11/25/2008  Job:  [id number]

## 2011-06-14 ENCOUNTER — Emergency Department
Admission: EM | Admit: 2011-06-14 | Discharge: 2011-06-14 | Disposition: A | Discharge status: Home / Self Care | Payer: 59 | Source: Home / Self Care | Attending: Family Medicine | Admitting: Family Medicine

## 2011-06-14 ENCOUNTER — Encounter: Payer: Self-pay | Admitting: Emergency Medicine

## 2011-06-14 DIAGNOSIS — J0100 Acute maxillary sinusitis, unspecified: Secondary | ICD-10-CM

## 2011-06-14 HISTORY — DX: Disorder of thyroid, unspecified: E07.9

## 2011-06-14 HISTORY — DX: Anxiety disorder, unspecified: F41.9

## 2011-06-14 HISTORY — DX: Chronic sinusitis, unspecified: J32.9

## 2011-06-14 MED ORDER — AMOXICILLIN 875 MG PO TABS: 875.0000 mg | ORAL_TABLET | Freq: Two times a day (BID) | ORAL | Status: AC

## 2011-06-14 NOTE — ED Provider Notes (Signed)
History     CSN: [id number]  Arrival date & time 06/14/11  1558   First MD Initiated Contact with Patient 06/14/11 1617      Chief Complaint  Patient presents with  . Sinusitis     HPI Comments: Patient complains of onset of mild URI symptoms about 2 weeks ago, with mild sore throat, nasal congestion, and minimal cough.  Symptoms improved except for nasal congestion, and over the past two days she has had increasing facial discomfort especially on the right.  Her ears have also felt clogged.  She has had chills during the past two days.  Patient is a 51 y.o. female presenting with sinusitis. The history is provided by the patient.  Sinusitis  This is a new problem. The problem has been gradually worsening. There has been no fever. Associated symptoms include chills, congestion, ear pain and sinus pressure. Pertinent negatives include no sweats, no hoarse voice, no sore throat, no swollen glands, no cough and no shortness of breath. Treatments tried: decongestant. The treatment provided no relief.    Past Medical History  Diagnosis Date  . Thyroid disease   . Anxiety   . Recurrent sinus infections     Past Surgical History  Procedure Date  . Thyroidectomy   . Colon surgery     History reviewed. No pertinent family history.  History  Substance Use Topics  . Smoking status: Never Smoker   . Smokeless tobacco: Not on file  . Alcohol Use: No    OB History    Grav Para Term Preterm Abortions TAB SAB Ect Mult Living                  Review of Systems  Constitutional: Positive for chills.  HENT: Positive for ear pain, congestion and sinus pressure. Negative for sore throat and hoarse voice.   Respiratory: Negative for cough and shortness of breath.    No sore throat presently No cough No pleuritic pain No wheezing + nasal congestion + post-nasal drainage + sinus pain/pressure No itchy/red eyes ? earache No hemoptysis No SOB No fever, + chills for about two  days No nausea No vomiting No abdominal pain No diarrhea No urinary symptoms No skin rashes No fatigue No myalgias No headache Used OTC meds without relief  Allergies  Review of patient's allergies indicates no known allergies.  Home Medications   Current Outpatient Rx  Name Route Sig Dispense Refill  . CALCITRIOL 0.5 MCG PO CAPS Oral Take 0.5 mcg by mouth daily.    Marland Kitchen CALCIUM CARBONATE ANTACID 500 MG PO CHEW Oral Chew 2 tablets by mouth 2 (two) times daily.    Marland Kitchen CLONAZEPAM 0.5 MG PO TABS Oral Take 0.5 mg by mouth 2 (two) times daily as needed.    Marland Kitchen LEVOTHYROXINE SODIUM 137 MCG PO TABS Oral Take 137 mcg by mouth daily.    . AMOXICILLIN 875 MG PO TABS Oral Take 1 tablet (875 mg total) by mouth 2 (two) times daily. 20 tablet 0    BP 123/87  Pulse 71  Temp(Src) 98.1 F (36.7 C) (Oral)  Resp 18  Ht 5\' 6"  (1.676 m)  Wt 190 lb (86.183 kg)  BMI 30.67 kg/m2  SpO2 97%  Physical Exam Nursing notes and Vital Signs reviewed. Appearance:  Patient appears healthy, stated age, and in no acute distress Eyes:  Pupils are equal, round, and reactive to light and accomodation.  Extraocular movement is intact.  Conjunctivae are not inflamed  Ears:  Canals normal.  Tympanic membranes normal.  Nose:  Moderately congested turbinates, worse on the right.   Maxillary sinus tenderness is present.  Pharynx:  Normal Neck:  Supple.   No adenopathy Lungs:  Clear to auscultation.  Breath sounds are equal.  Heart:  Regular rate and rhythm without murmurs, rubs, or gallops.    ED Course  Procedures  none      1. Acute maxillary sinusitis       MDM  Begin amoxicillin. Take Mucinex D (guaifenesin with decongestant) twice daily for congestion.  Increase fluid intake, rest. May use Afrin nasal spray (or generic oxymetazoline) twice daily for about 5 days.  Also recommend using saline nasal spray several times daily and saline nasal irrigation. Stop all antihistamines for now, and other  non-prescription cough/cold preparations. Followup with PCP if not improving one week.           Donna Christen, MD 06/14/11 [phone]

## 2011-06-14 NOTE — ED Notes (Signed)
Sinus congestion with eye pain and ear fullness x 2 weeks. No Flu vaccination this season. No recent OTCs.

## 2011-07-31 ENCOUNTER — Emergency Department
Admission: EM | Admit: 2011-07-31 | Discharge: 2011-07-31 | Disposition: A | Discharge status: Home Health | Payer: 59 | Source: Home / Self Care | Attending: Family Medicine | Admitting: Family Medicine

## 2011-07-31 DIAGNOSIS — R509 Fever, unspecified: Secondary | ICD-10-CM

## 2011-07-31 LAB — POCT URINALYSIS DIPSTICK
Bilirubin, UA: NEGATIVE
Ketones, UA: NEGATIVE
pH, UA: 6 (ref 5–8)

## 2011-07-31 MED ORDER — AMOXICILLIN 875 MG PO TABS: 875.0000 mg | ORAL_TABLET | Freq: Two times a day (BID) | ORAL | Status: AC

## 2011-07-31 NOTE — ED Notes (Signed)
Pt c/o body aches, chills, fever onset last night.  Pt states her daughter was DX with strep yesterday.

## 2011-07-31 NOTE — Discharge Instructions (Signed)
Rest, increase fluid intake.  Check temperature daily. May take Ibuprofen 200mg , 4 tabs every 8 hours with food for fever, body aches, etc.

## 2011-07-31 NOTE — ED Provider Notes (Signed)
History     CSN: [id number]  Arrival date & time 07/31/11  1646   First MD Initiated Contact with Patient 07/31/11 1703      Chief Complaint  Patient presents with  . Generalized Body Aches    (Consider location/radiation/quality/duration/timing/severity/associated sxs/prior treatment) HPI  Past Medical History  Diagnosis Date  . Thyroid disease   . Anxiety   . Recurrent sinus infections     Past Surgical History  Procedure Date  . Thyroidectomy   . Colon surgery     No family history on file.  History  Substance Use Topics  . Smoking status: Never Smoker   . Smokeless tobacco: Not on file  . Alcohol Use: No    OB History    Grav Para Term Preterm Abortions TAB SAB Ect Mult Living                  Review of Systems  Allergies  Review of patient's allergies indicates no known allergies.  Home Medications   Current Outpatient Rx  Name Route Sig Dispense Refill  . AMOXICILLIN 875 MG PO TABS Oral Take 1 tablet (875 mg total) by mouth 2 (two) times daily. 20 tablet 0  . CALCITRIOL 0.5 MCG PO CAPS Oral Take 0.5 mcg by mouth daily.    Marland Kitchen CALCIUM CARBONATE ANTACID 500 MG PO CHEW Oral Chew 2 tablets by mouth 2 (two) times daily.    Marland Kitchen CLONAZEPAM 0.5 MG PO TABS Oral Take 0.5 mg by mouth 2 (two) times daily as needed.    Marland Kitchen LEVOTHYROXINE SODIUM 137 MCG PO TABS Oral Take 137 mcg by mouth daily.      BP 136/79  Pulse 97  Temp(Src) 99.6 F (37.6 C) (Oral)  Resp 16  Ht 5\' 6"  (1.676 m)  Wt 196 lb 8 oz (89.132 kg)  BMI 31.72 kg/m2  SpO2 97%  Physical Exam  ED Course  Procedures (including critical care time)   Labs Reviewed  POCT URINALYSIS DIPSTICK  POCT INFLUENZA A/B  STREP A DNA PROBE   No results found.   1. Fever       MDM  Suspect early viral URI Since daughter has confirmed strep infection, will empirically begin amoxicillin while throat culture pending Rest, increase fluid intake.  Check temperature daily. May take Ibuprofen 200mg , 4  tabs every 8 hours with food for fever, body aches, etc. Followup with PCP if not improved 6 days.        Donna Christen, MD 08/01/11 [phone]

## 2011-08-01 LAB — STREP A DNA PROBE: GASP: NEGATIVE

## 2011-08-01 NOTE — ED Provider Notes (Signed)
History     CSN: [id number]  Arrival date & time 07/31/11  1646   First MD Initiated Contact with Patient 07/31/11 1703      Chief Complaint  Patient presents with  . Generalized Body Aches     HPI Comments: Patient complains of onset of chills, myalgias, fatigue, and headache last night without other symptoms.  Her daughter has just been treated for strep pharyngitis.  The history is provided by the patient.    Past Medical History  Diagnosis Date  . Thyroid disease   . Anxiety   . Recurrent sinus infections     Past Surgical History  Procedure Date  . Thyroidectomy   . Colon surgery     No family history on file.  History  Substance Use Topics  . Smoking status: Never Smoker   . Smokeless tobacco: Not on file  . Alcohol Use: No    OB History    Grav Para Term Preterm Abortions TAB SAB Ect Mult Living                  Review of Systems No sore throat No cough No pleuritic pain No wheezing No nasal congestion No post-nasal drainage No sinus pain/pressure No itchy/red eyes No earache No hemoptysis No SOB No fever, + chills No nausea No vomiting No abdominal pain No diarrhea No urinary symptoms No skin rashes + fatigue + myalgias + headache Used OTC meds without relief  Allergies  Review of patient's allergies indicates no known allergies.  Home Medications   Current Outpatient Rx  Name Route Sig Dispense Refill  . AMOXICILLIN 875 MG PO TABS Oral Take 1 tablet (875 mg total) by mouth 2 (two) times daily. 20 tablet 0  . CALCITRIOL 0.5 MCG PO CAPS Oral Take 0.5 mcg by mouth daily.    Marland Kitchen CALCIUM CARBONATE ANTACID 500 MG PO CHEW Oral Chew 2 tablets by mouth 2 (two) times daily.    Marland Kitchen CLONAZEPAM 0.5 MG PO TABS Oral Take 0.5 mg by mouth 2 (two) times daily as needed.    Marland Kitchen LEVOTHYROXINE SODIUM 137 MCG PO TABS Oral Take 137 mcg by mouth daily.      BP 136/79  Pulse 97  Temp(Src) 99.6 F (37.6 C) (Oral)  Resp 16  Ht 5\' 6"  (1.676 m)  Wt 196  lb 8 oz (89.132 kg)  BMI 31.72 kg/m2  SpO2 97%  Physical Exam Nursing notes and Vital Signs reviewed. Appearance:  Patient appears healthy, stated age, and in no acute distress Eyes:  Pupils are equal, round, and reactive to light and accomodation.  Extraocular movement is intact.  Conjunctivae are not inflamed  Ears:  Canals normal.  Tympanic membranes normal.  Nose:  Mildly congested turbinates.  No sinus tenderness.   Pharynx:  Normal Neck:  Supple.  Slightly tender shotty posterior nodes are palpated bilaterally  Lungs:  Clear to auscultation.  Breath sounds are equal.  Heart:  Regular rate and rhythm without murmurs, rubs, or gallops.  Abdomen:  Nontender without masses or hepatosplenomegaly.  Bowel sounds are present.  No CVA or flank tenderness.  Extremities:  No edema.  No calf tenderness Skin:  No rash present.   ED Course  Procedures  none   Labs Reviewed  POCT URINALYSIS DIPSTICK mod blood, otherwise negative  POCT INFLUENZA A/B negative  STREP A DNA PROBE pending      1. Fever       MDM  Suspect early viral URI Since  daughter has confirmed strep infection, will empirically begin amoxicillin while throat culture pending Rest, increase fluid intake.  Check temperature daily. May take Ibuprofen 200mg , 4 tabs every 8 hours with food for fever, body aches, etc. Followup with PCP if not improved 6 days.        Donna Christen, MD 08/01/11 [phone]

## 2012-03-11 ENCOUNTER — Ambulatory Visit (INDEPENDENT_AMBULATORY_CARE_PROVIDER_SITE_OTHER): Payer: 59 | Admitting: Family Medicine

## 2012-03-11 VITALS — BP 122/86 | HR 64 | Temp 98.0°F | Resp 18 | Ht 66.0 in | Wt 199.0 lb

## 2012-03-11 DIAGNOSIS — Z0000 Encounter for general adult medical examination without abnormal findings: Secondary | ICD-10-CM

## 2012-03-11 DIAGNOSIS — E039 Hypothyroidism, unspecified: Secondary | ICD-10-CM | POA: Insufficient documentation

## 2012-03-11 DIAGNOSIS — Z23 Encounter for immunization: Secondary | ICD-10-CM

## 2012-03-11 LAB — POCT CBC
Lymph, poc: 2.7 (ref 0.6–3.4)
MCH, POC: 30.6 pg (ref 27–31.2)
MCHC: 31.9 g/dL (ref 31.8–35.4)
MID (cbc): 0.7 (ref 0–0.9)
MPV: 10.4 fL (ref 0–99.8)
POC LYMPH PERCENT: 34.1 %L (ref 10–50)
POC MID %: 9.6 %M (ref 0–12)
Platelet Count, POC: 242 10*3/uL (ref 142–424)
WBC: 7.8 10*3/uL (ref 4.6–10.2)

## 2012-03-11 NOTE — Progress Notes (Signed)
  Subjective:    Patient ID: Jennifer Reeves, female    DOB: [birth year], 51 y.o.   MRN: [id number]  HPI    Review of Systems     Objective:   Physical Exam        Assessment & Plan:

## 2012-03-11 NOTE — Progress Notes (Signed)
History: 51 year old lady who's here for a physical examination. She is working to do lab work as a Comptroller. 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 She is here for regular physical examination. She said she will sometimes see a gynecologist to get the GYN exam done. She had a mammogram this summer. She is generally healthy.  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 She does not exercise regularly.  Preventive history: Last Pap smear was approximately 2 years ago. Mammogram July 2013  Review of systems: Constitutional: Unremarkable HEENT: Unremarkable Respiratory: Unremarkable Cardiovascular: Unremarkable. She does have varicose veins in her legs which have been getting more prominent. Gastrointestinal: Unremarkable Genitourinary: Unremarkable Musculoskeletal: Intermittent back pain in the low back. She has done exercises and see a chiropractor for this. Has problems intermittently. Skin: Unremarkable Neurologic: Unremarkable Hematologic: Unremarkable Psychiatric: Unremarkable Endocrinologic: Has surgical hypothyroidism Gynecologic: Assess. September 15. Otherwise normal.  Physical examination: General she is well-developed well-nourished lady in no acute distress, moderately  overweight. HEENT: Eyes PERRLA. Fundi benign. TMs normal. Throat clear. Neck supple without nodes thyromegaly. No carotid bruits. Chest clear to auscultation. Heart regular without murmurs gallops or arrhythmias. Breasts not examined. Abdomen soft without mass or tenderness. Pelvic not done at this time. Extremities are without edema. She does have medium sized varicosities on her legs below the knees with some small spider varicosities also.  Assessment: Normal physical exam Low back pain History of colon cancer History of thyroidectomy and hypothyroidism  Plan: Update immunizations and review them for the school form.   Questionnaire regarding tuberculosis screening was done. The patient is deemed to be low risk. She has not been out of the country in 5 years, and had a negative PPD since then. She does have a history of having a BCG. I believe it PPD screening can safely be deferred until after the shortage.  Results for orders placed in visit on 03/11/12  POCT CBC      Component Value Range   WBC 7.8  4.6 - 10.2 K/uL   Lymph, poc 2.7  0.6 - 3.4   POC LYMPH PERCENT 34.1  10 - 50 %L   MID (cbc) 0.7  0 - 0.9   POC MID % 9.6  0 - 12 %M   POC Granulocyte 4.4  2 - 6.9   Granulocyte percent 56.3  37 - 80 %G   RBC 4.77  4.04 - 5.48 M/uL   Hemoglobin 14.6  12.2 - 16.2 g/dL   HCT, POC 84.6  96.2 - 47.9 %   MCV 95.9  80 - 97 fL   MCH, POC 30.6  27 - 31.2 pg   MCHC 31.9  31.8 - 35.4 g/dL   RDW, POC 95.2     Platelet Count, POC 242  142 - 424 K/uL   MPV 10.4  0 - 99.8 fL  POCT GLYCOSYLATED HEMOGLOBIN (HGB A1C)      Component Value Range   Hemoglobin A1C 5.2

## 2012-03-11 NOTE — Progress Notes (Signed)
  Tuberculosis Risk Questionnaire  1. Were you born outside the [place] in one of the following parts of the world:    [place], [place], [place], [place] or [place]?  Yes Born in [place]  2. Have you traveled outside the [place] and lived for more than one month in one of the following parts of the world:  [place], [place], [place], [place] or [place]?  No  3. Do you have a compromised immune system such as from any of the following conditions:  HIV/AIDS, organ or bone marrow transplantation, diabetes, immunosuppressive   medicines (e.g. Prednisone, Remicaide), leukemia, lymphoma, cancer of the   head or neck, gastrectomy or jejunal bypass, end-stage renal disease (on   dialysis), or silicosis?  No    4. Have you ever done one of the following:    Used crack cocaine, injected illegal drugs, worked or resided in jail or prison,   worked or resided at a homeless shelter, or worked as a Research scientist (physical sciences) in   direct contact with patients?  No  5. Have you ever been exposed to anyone with infectious tuberculosis?  No   Tuberculosis Symptom Questionnaire  Do you currently have any of the following symptoms?  1. Unexplained cough lasting more than 3 weeks? No  Unexplained fever lasting more than 3 weeks. No   3. Night Sweats (sweating that leaves the bedclothes and sheets wet)   No  4. Shortness of Breath No  5. Chest Pain No  6. Unintentional weight loss  No  7. Unexplained fatigue (very tired for no reason) No

## 2012-03-12 ENCOUNTER — Encounter: Payer: Self-pay | Admitting: Family Medicine

## 2012-03-12 ENCOUNTER — Encounter: Payer: Self-pay | Admitting: Physician Assistant

## 2012-03-12 LAB — LIPID PANEL
HDL: 55 mg/dL (ref >39)
LDL Cholesterol: 136 mg/dL — ABNORMAL HIGH (ref 0–99)
Total CHOL/HDL Ratio: 4.1 Ratio
Triglycerides: 162 mg/dL — ABNORMAL HIGH (ref <150)

## 2012-03-12 LAB — COMPREHENSIVE METABOLIC PANEL
AST: 19 U/L (ref 0–37)
Alkaline Phosphatase: 61 U/L (ref 39–117)
BUN: 18 mg/dL (ref 6–23)
Calcium: 9.7 mg/dL (ref 8.4–10.5)
Creat: 0.85 mg/dL (ref 0.50–1.10)

## 2012-03-18 ENCOUNTER — Emergency Department
Admission: EM | Admit: 2012-03-18 | Discharge: 2012-03-18 | Disposition: A | Discharge status: Home / Self Care | Payer: Self-pay | Source: Home / Self Care

## 2012-03-18 DIAGNOSIS — Z111 Encounter for screening for respiratory tuberculosis: Secondary | ICD-10-CM

## 2012-03-18 MED ORDER — TUBERCULIN PPD 5 UNIT/0.1ML ID SOLN
5.0000 [IU] | Freq: Once | INTRADERMAL | Status: AC
  Administered 2012-03-18: 5 [IU] via INTRADERMAL

## 2012-03-20 ENCOUNTER — Emergency Department
Admission: EM | Admit: 2012-03-20 | Discharge: 2012-03-20 | Disposition: A | Discharge status: Home / Self Care | Payer: 59 | Source: Home / Self Care

## 2012-03-20 NOTE — ED Notes (Signed)
TB reading 0mm

## 2012-03-20 NOTE — ED Notes (Signed)
21mm-Neg

## 2012-03-27 ENCOUNTER — Emergency Department
Admission: EM | Admit: 2012-03-27 | Discharge: 2012-03-27 | Disposition: A | Discharge status: Home / Self Care | Payer: Self-pay | Source: Home / Self Care

## 2012-03-27 ENCOUNTER — Encounter: Payer: Self-pay | Admitting: *Deleted

## 2012-03-27 DIAGNOSIS — Z111 Encounter for screening for respiratory tuberculosis: Secondary | ICD-10-CM

## 2012-03-27 MED ORDER — TUBERCULIN PPD 5 UNIT/0.1ML ID SOLN
5.0000 [IU] | Freq: Once | INTRADERMAL | Status: AC
  Administered 2012-03-27: 5 [IU] via INTRADERMAL

## 2012-03-29 ENCOUNTER — Emergency Department
Admission: EM | Admit: 2012-03-29 | Discharge: 2012-03-29 | Disposition: A | Discharge status: Home / Self Care | Payer: Self-pay | Source: Home / Self Care

## 2012-03-29 NOTE — ED Notes (Signed)
Patient presents for reading of PPD placed on left forearm 03-27-12; area of arm without induration or redness= negative.

## 2012-04-22 ENCOUNTER — Other Ambulatory Visit: Payer: Self-pay | Admitting: Endocrinology

## 2012-04-22 DIAGNOSIS — N951 Menopausal and female climacteric states: Secondary | ICD-10-CM

## 2012-05-05 ENCOUNTER — Ambulatory Visit
Admission: RE | Admit: 2012-05-05 | Discharge: 2012-05-05 | Disposition: A | Discharge status: Home / Self Care | Payer: 59 | Source: Ambulatory Visit | Attending: Endocrinology | Admitting: Endocrinology

## 2012-05-05 DIAGNOSIS — N951 Menopausal and female climacteric states: Secondary | ICD-10-CM

## 2012-05-06 ENCOUNTER — Other Ambulatory Visit: Payer: 59

## 2016-06-30 ENCOUNTER — Encounter: Payer: Self-pay | Admitting: Emergency Medicine

## 2016-06-30 ENCOUNTER — Emergency Department
Admission: EM | Admit: 2016-06-30 | Discharge: 2016-06-30 | Disposition: A | Discharge status: Home / Self Care | Payer: BLUE CROSS/BLUE SHIELD | Source: Home / Self Care | Attending: Family Medicine | Admitting: Family Medicine

## 2016-06-30 DIAGNOSIS — S61221A Laceration with foreign body of left index finger without damage to nail, initial encounter: Secondary | ICD-10-CM

## 2016-06-30 MED ORDER — CEPHALEXIN 500 MG PO CAPS: 500.0000 mg | ORAL_CAPSULE | Freq: Two times a day (BID) | ORAL | 0 refills | Status: AC

## 2016-06-30 NOTE — ED Triage Notes (Signed)
Pt c/o cutting her left index finger on a knife while cooking last night. States she cleaned it with soap and water. Unsure of last tetanus.

## 2016-06-30 NOTE — ED Provider Notes (Signed)
CSN: [id number]     Arrival date & time 06/30/16  1230 History   First MD Initiated Contact with Patient 06/30/16 1314     Chief Complaint  Patient presents with  . Extremity Laceration   (Consider location/radiation/quality/duration/timing/severity/associated sxs/prior Treatment) HPI Jennifer Reeves is a 55 y.o. female presenting to UC with c/o laceration to her Left index finger last night with a knife while cooking.  Bleeding lasted about 2 hours so she used OTC clotting powder to help. Bleeding controlled PTA with clotting powder dried onto her finger.  She is unsure of her last tetanus, per medical records it was in 2009.  Denies other injuries. She is not on blood thinners.    Past Medical History:  Diagnosis Date  . Anxiety   . Recurrent sinus infections   . Thyroid disease    Past Surgical History:  Procedure Laterality Date  . COLON SURGERY    . THYROIDECTOMY     History reviewed. No pertinent family history. Social History  Substance Use Topics  . Smoking status: Former Research scientist (life sciences)  . Smokeless tobacco: Never Used  . Alcohol use Yes   OB History    No data available     Review of Systems  Musculoskeletal: Negative for arthralgias and myalgias.  Skin: Positive for wound. Negative for color change.  Neurological: Negative for weakness and numbness.    Allergies  Patient has no known allergies.  Home Medications   Prior to Admission medications   Medication Sig Start Date End Date Taking? Authorizing Provider  calcitRIOL (ROCALTROL) 0.5 MCG capsule Take 0.5 mcg by mouth daily.    Historical Provider, MD  calcium carbonate (TUMS - DOSED IN MG ELEMENTAL CALCIUM) 500 MG chewable tablet Chew 2 tablets by mouth 2 (two) times daily.    Historical Provider, MD  cephALEXin (KEFLEX) 500 MG capsule Take 1 capsule (500 mg total) by mouth 2 (two) times daily. 06/30/16   Noland Fordyce, PA-C  clonazePAM (KLONOPIN) 0.5 MG tablet Take 0.5 mg by mouth 2 (two) times daily as  needed.    Historical Provider, MD  levothyroxine (SYNTHROID, LEVOTHROID) 137 MCG tablet Take 137 mcg by mouth daily.    Historical Provider, MD   Meds Ordered and Administered this Visit  Medications - No data to display  BP 129/84 (BP Location: Right Arm)   Pulse 67   Temp 98.1 F (36.7 C) (Oral)   Wt 200 lb (90.7 kg)   SpO2 96%   BMI 32.28 kg/m  No data found.   Physical Exam  Constitutional: She is oriented to person, place, and time. She appears well-developed and well-nourished. No distress.  HENT:  Head: Normocephalic and atraumatic.  Eyes: EOM are normal.  Neck: Normal range of motion.  Cardiovascular: Normal rate.   Pulmonary/Chest: Effort normal.  Musculoskeletal: Normal range of motion. She exhibits tenderness. She exhibits no edema.  Left index finger: full ROM, mild tenderness to distal aspect where wound located.  Neurological: She is alert and oriented to person, place, and time.  Skin: Skin is warm and dry. Capillary refill takes less than 2 seconds. She is not diaphoretic. No erythema.  Left index finger, volar aspect: thick dried brown substance covering wound. No active bleeding or drainage.   Psychiatric: She has a normal mood and affect. Her behavior is normal.  Nursing note and vitals reviewed.   Urgent Care Course     Procedures (including critical care time)  Labs Review Labs Reviewed - No data to  display  Imaging Review No results found.  Allowed finger to soak in warm water and hibiclens, unable to get clotting substance off finger. Added some peroxide. Only able to get small pieces of substance off finger w/o causing discomfort to pt. Concern for doing additional damage to underlying soft tissue by removing remainder of substance on her finger.   MDM   1. Laceration of left index finger with foreign body without damage to nail, initial encounter    Laceration to Left index finger w/o evidence of underlying tendon involvement.  bacitracin  and bandage applied to finger. Encouraged to soak several times a day and to apply OTC antibiotic ointment or Vaseline to help loosen and soften clotting powder.   Rx: Keflex Encouraged f/u with Sports Medicine this week for wound recheck. Patient verbalized understanding and agreement with treatment plan.     Noland Fordyce, PA-C 06/30/16 1512

## 2016-06-30 NOTE — Discharge Instructions (Signed)
°  You should gentle wash your hands with warm soap and water throughout the day and soak your finger in warm soapy water or with 50% water 50% peroxide to help get the clotting powder off.  Make sure to pat dry the wound and apply over the counter antibiotic ointment such as Neosporin or Polysporin, then a bandage 2-3 times daily.    Follow up with Sports Medicine as recommended by urgent care next week for recheck of symptoms.  Please let the receptionist know you were referred from our urgent care when making the follow up appointment to make sure you get seen as soon as possible.

## 2016-06-30 NOTE — ED Triage Notes (Signed)
Tetanus in 2009

## 2017-11-19 ENCOUNTER — Other Ambulatory Visit: Payer: Self-pay | Admitting: Endocrinology

## 2017-11-19 DIAGNOSIS — Z1231 Encounter for screening mammogram for malignant neoplasm of breast: Secondary | ICD-10-CM

## 2017-11-29 ENCOUNTER — Other Ambulatory Visit: Payer: Self-pay | Admitting: Endocrinology

## 2017-11-29 DIAGNOSIS — R5381 Other malaise: Secondary | ICD-10-CM

## 2017-11-29 DIAGNOSIS — Z1231 Encounter for screening mammogram for malignant neoplasm of breast: Secondary | ICD-10-CM

## 2017-12-04 ENCOUNTER — Other Ambulatory Visit: Payer: Self-pay | Admitting: Endocrinology

## 2017-12-04 DIAGNOSIS — E892 Postprocedural hypoparathyroidism: Secondary | ICD-10-CM

## 2017-12-04 DIAGNOSIS — Z9889 Other specified postprocedural states: Secondary | ICD-10-CM

## 2018-01-24 ENCOUNTER — Ambulatory Visit
Admission: RE | Admit: 2018-01-24 | Discharge: 2018-01-24 | Disposition: A | Discharge status: Home / Self Care | Payer: BLUE CROSS/BLUE SHIELD | Source: Ambulatory Visit | Attending: Endocrinology | Admitting: Endocrinology

## 2018-01-24 DIAGNOSIS — E892 Postprocedural hypoparathyroidism: Secondary | ICD-10-CM

## 2018-01-24 DIAGNOSIS — Z1231 Encounter for screening mammogram for malignant neoplasm of breast: Secondary | ICD-10-CM

## 2018-01-24 DIAGNOSIS — Z9889 Other specified postprocedural states: Secondary | ICD-10-CM

## 2020-04-19 IMAGING — MG DIGITAL SCREENING BILATERAL MAMMOGRAM WITH TOMO AND CAD
8 series · 9 of 24 positions shown · non-contrast
Comparison: Previous exam(s).

CLINICAL DATA: Screening.

EXAM:
DIGITAL SCREENING BILATERAL MAMMOGRAM WITH TOMO AND CAD

[R MLO synth-2D]
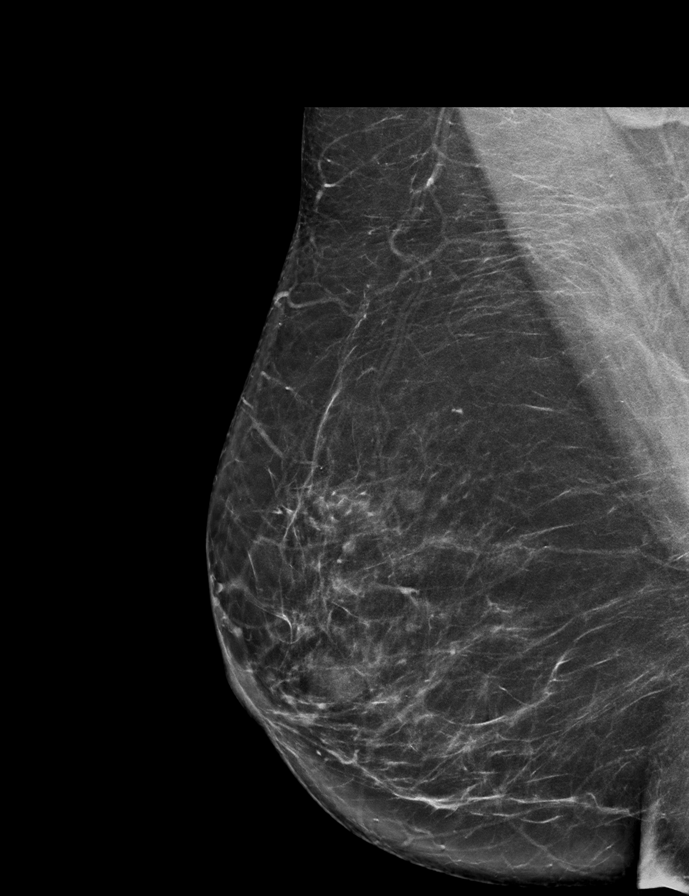

[L MLO synth-2D]
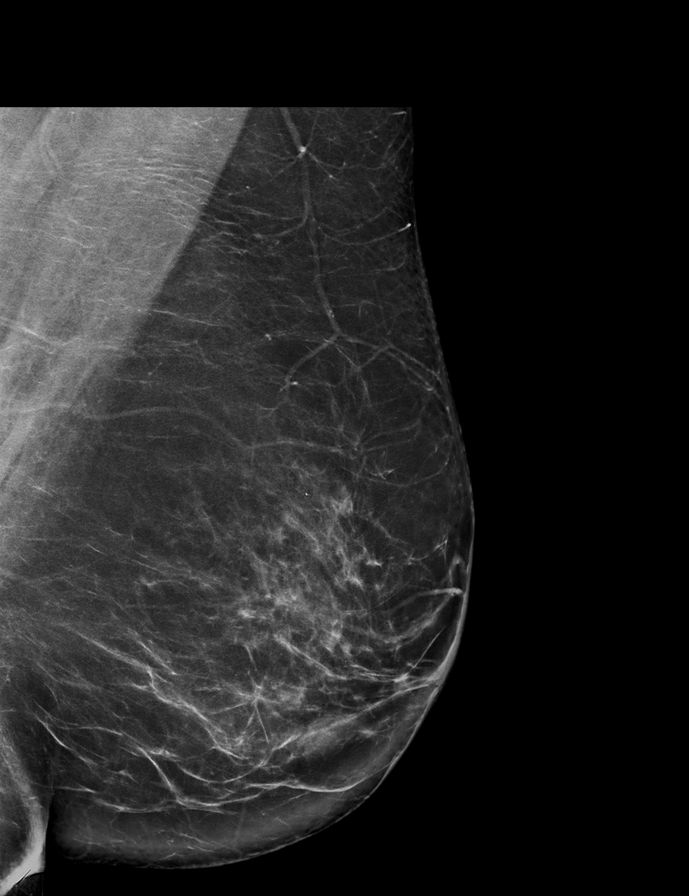

[L CC synth-2D]
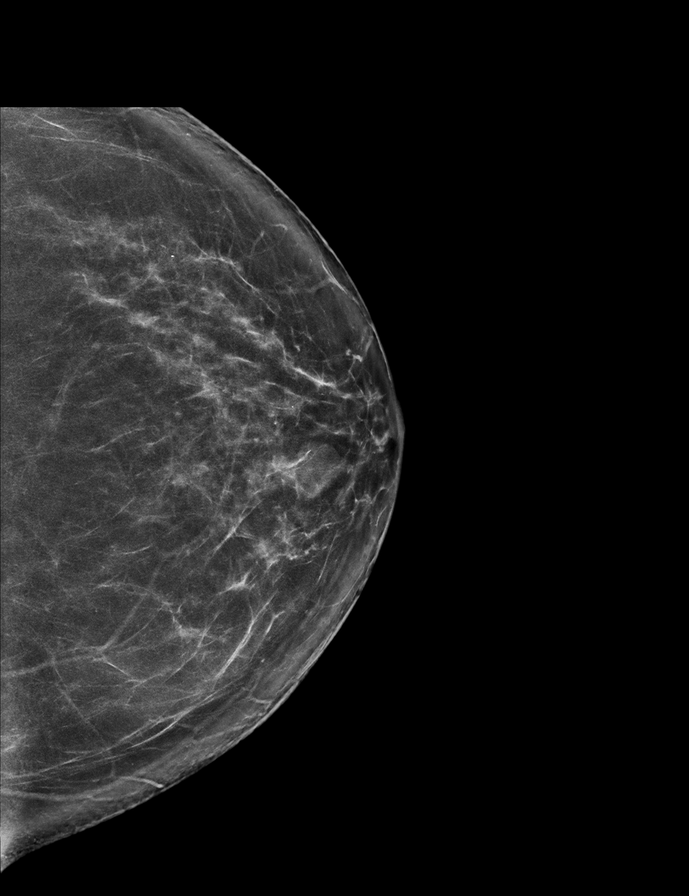

[R CC synth-2D]
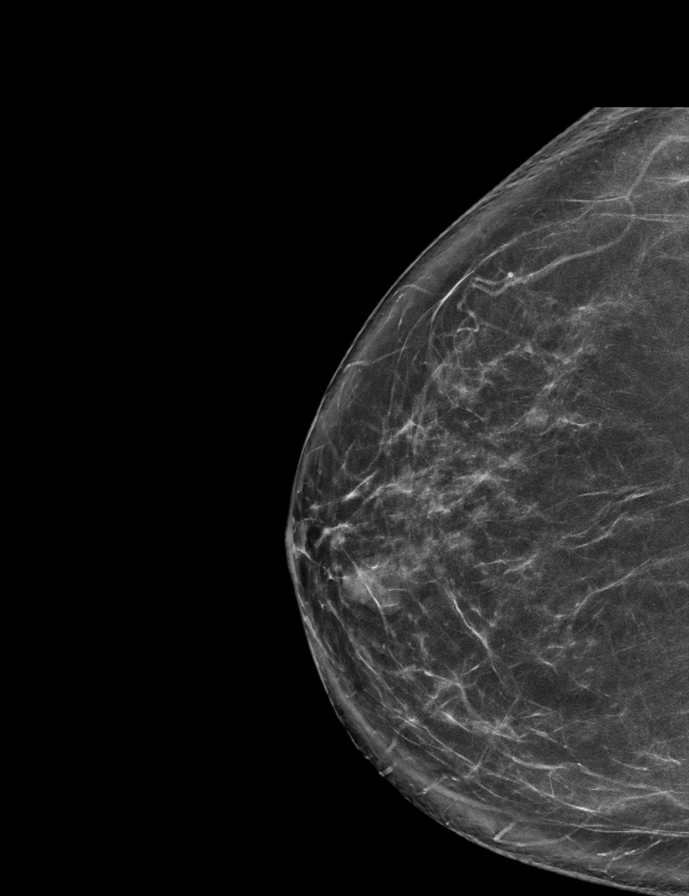

[L MLO tomo · 2 of 84 frames shown]
[frame 28/84]
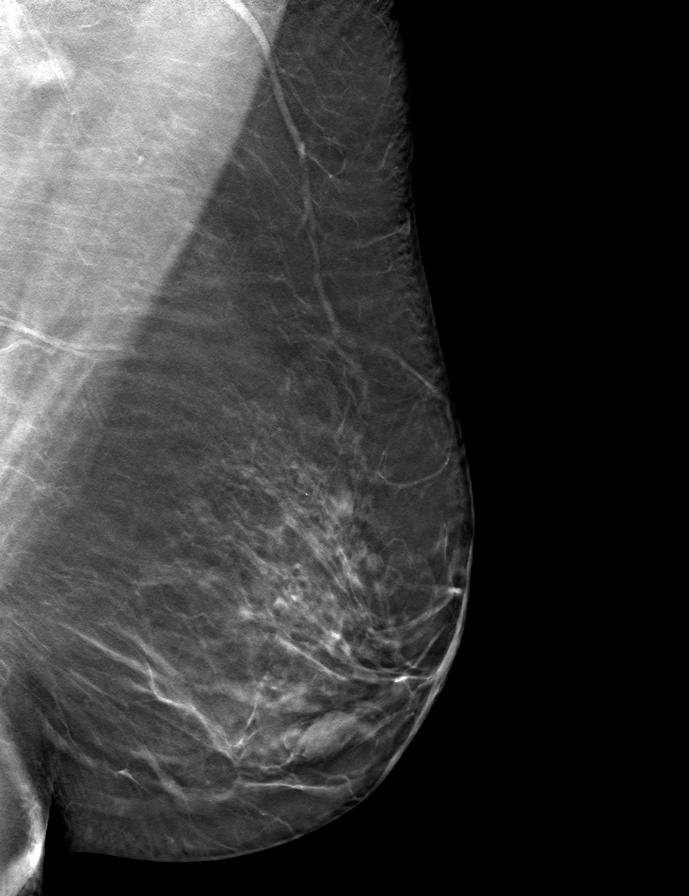
[frame 43/84]
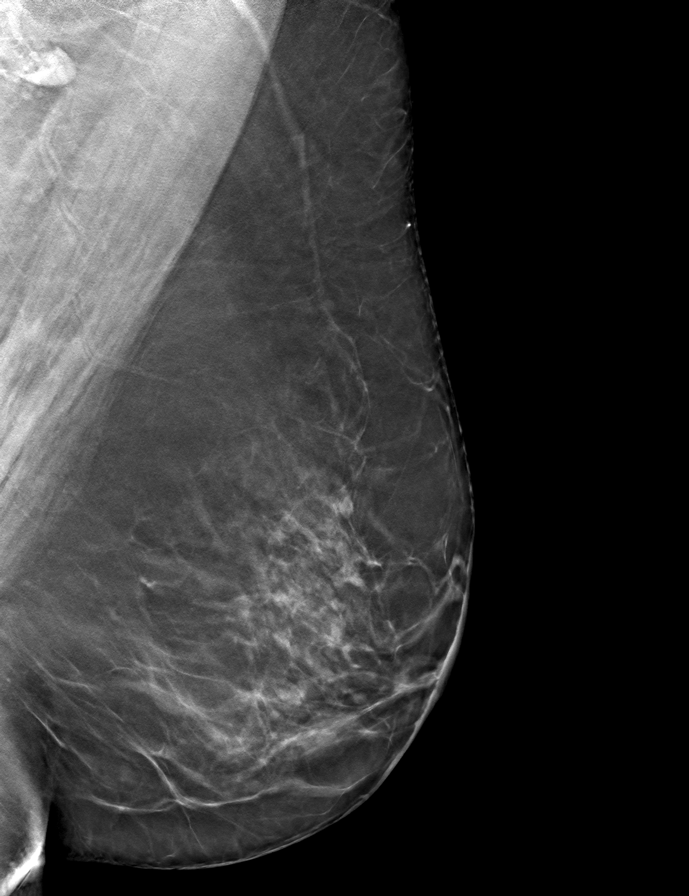

[R CC tomo · tomo slice 37/74.0]
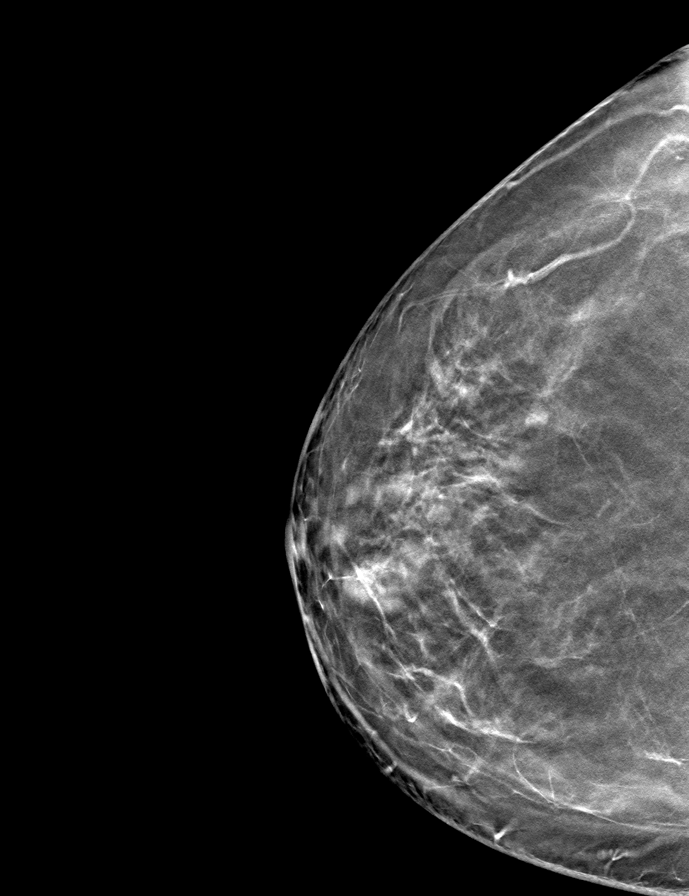

[L CC tomo · tomo slice 41/80.0]
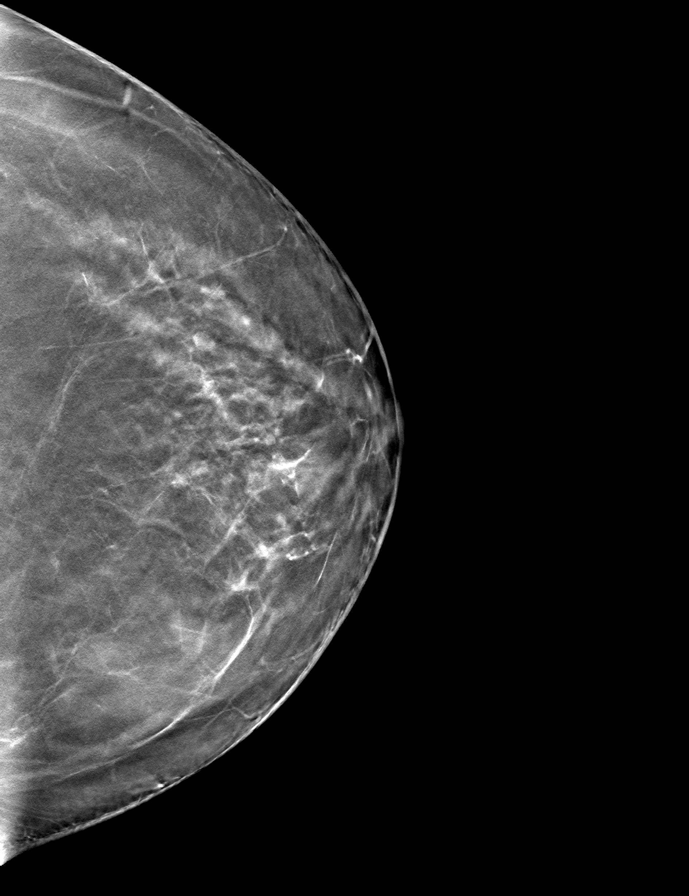

[R MLO tomo · tomo slice 41/80.0]
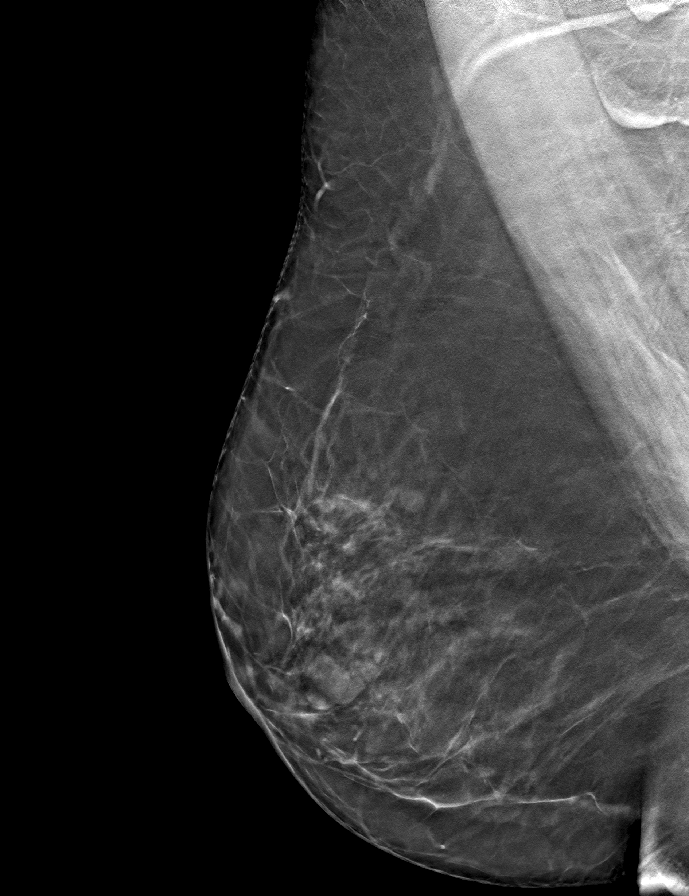

[9 of 24 positions shown; findings below may reference images not displayed]

ACR Breast Density Category b: There are scattered areas of
fibroglandular density.
FINDINGS: There are no findings suspicious for malignancy. Images were
processed with CAD.
IMPRESSION: No mammographic evidence of malignancy. A result letter of this
screening mammogram will be mailed directly to the patient.

RECOMMENDATION:
Screening mammogram in one year. (Code:[id number])

BI-RADS CATEGORY  1: Negative.
